# Patient Record
Sex: Male | Born: 1960 | Hispanic: Yes | Marital: Married | Smoking: Never smoker
Health system: Southern US, Community
[De-identification: ages and names within clinical notes are randomized; demographics above are authoritative.]

---

## 2015-12-29 ENCOUNTER — Encounter (HOSPITAL_COMMUNITY): Payer: Self-pay | Admitting: Family Medicine

## 2015-12-29 ENCOUNTER — Emergency Department (HOSPITAL_COMMUNITY)
Admission: EM | Admit: 2015-12-29 | Discharge: 2015-12-29 | Disposition: A | Discharge status: Home / Self Care | Attending: Emergency Medicine | Admitting: Emergency Medicine

## 2015-12-29 ENCOUNTER — Emergency Department (HOSPITAL_COMMUNITY)

## 2015-12-29 DIAGNOSIS — N201 Calculus of ureter: Secondary | ICD-10-CM | POA: Diagnosis not present

## 2015-12-29 DIAGNOSIS — R109 Unspecified abdominal pain: Secondary | ICD-10-CM | POA: Diagnosis present

## 2015-12-29 DIAGNOSIS — N200 Calculus of kidney: Secondary | ICD-10-CM

## 2015-12-29 DIAGNOSIS — N138 Other obstructive and reflux uropathy: Secondary | ICD-10-CM | POA: Insufficient documentation

## 2015-12-29 LAB — COMPREHENSIVE METABOLIC PANEL
ALK PHOS: 64 U/L (ref 38–126)
ALT: 32 U/L (ref 17–63)
ANION GAP: 7 (ref 5–15)
AST: 26 U/L (ref 15–41)
Albumin: 3.2 g/dL — ABNORMAL LOW (ref 3.5–5.0)
BILIRUBIN TOTAL: 0.7 mg/dL (ref 0.3–1.2)
BUN: 13 mg/dL (ref 6–20)
CALCIUM: 8.7 mg/dL — AB (ref 8.9–10.3)
CO2: 25 mmol/L (ref 22–32)
Chloride: 103 mmol/L (ref 101–111)
Creatinine, Ser: 1.6 mg/dL — ABNORMAL HIGH (ref 0.61–1.24)
GFR calc non Af Amer: 48 mL/min — ABNORMAL LOW (ref >60)
GFR, EST AFRICAN AMERICAN: 55 mL/min — AB (ref >60)
Glucose, Bld: 162 mg/dL — ABNORMAL HIGH (ref 65–99)
Potassium: 3.9 mmol/L (ref 3.5–5.1)
SODIUM: 135 mmol/L (ref 135–145)
TOTAL PROTEIN: 6.6 g/dL (ref 6.5–8.1)

## 2015-12-29 LAB — CBC WITH DIFFERENTIAL/PLATELET
Basophils Absolute: 0.1 10*3/uL (ref 0.0–0.1)
Basophils Relative: 1 %
EOS ABS: 0.1 10*3/uL (ref 0.0–0.7)
Eosinophils Relative: 1 %
HEMATOCRIT: 41.8 % (ref 39.0–52.0)
HEMOGLOBIN: 13.5 g/dL (ref 13.0–17.0)
LYMPHS ABS: 1.1 10*3/uL (ref 0.7–4.0)
Lymphocytes Relative: 13 %
MCH: 25.7 pg — AB (ref 26.0–34.0)
MCHC: 32.3 g/dL (ref 30.0–36.0)
MCV: 79.6 fL (ref 78.0–100.0)
MONO ABS: 0.5 10*3/uL (ref 0.1–1.0)
MONOS PCT: 6 %
NEUTROS PCT: 79 %
Neutro Abs: 6.5 10*3/uL (ref 1.7–7.7)
Platelets: 168 10*3/uL (ref 150–400)
RBC: 5.25 MIL/uL (ref 4.22–5.81)
RDW: 13.5 % (ref 11.5–15.5)
WBC: 8.3 10*3/uL (ref 4.0–10.5)

## 2015-12-29 LAB — URINALYSIS, ROUTINE W REFLEX MICROSCOPIC
BILIRUBIN URINE: NEGATIVE
GLUCOSE, UA: NEGATIVE mg/dL
Hgb urine dipstick: NEGATIVE
Ketones, ur: NEGATIVE mg/dL
Leukocytes, UA: NEGATIVE
NITRITE: NEGATIVE
PH: 6 (ref 5.0–8.0)
Protein, ur: NEGATIVE mg/dL
SPECIFIC GRAVITY, URINE: 1.019 (ref 1.005–1.030)

## 2015-12-29 LAB — LIPASE, BLOOD: Lipase: 15 U/L (ref 11–51)

## 2015-12-29 MED ORDER — TAMSULOSIN HCL 0.4 MG PO CAPS: 0.4000 mg | ORAL_CAPSULE | Freq: Every day | ORAL | Status: AC

## 2015-12-29 MED ORDER — SODIUM CHLORIDE 0.9 % IV BOLUS (SEPSIS)
1000.0000 mL | Freq: Once | INTRAVENOUS | Status: AC
  Administered 2015-12-29: 1000 mL via INTRAVENOUS

## 2015-12-29 MED ORDER — ONDANSETRON 4 MG PO TBDP: 4.0000 mg | ORAL_TABLET | Freq: Three times a day (TID) | ORAL | Status: AC | PRN

## 2015-12-29 MED ORDER — HYDROMORPHONE HCL 1 MG/ML IJ SOLN
1.0000 mg | Freq: Once | INTRAMUSCULAR | Status: AC
  Administered 2015-12-29: 1 mg via INTRAVENOUS
  Filled 2015-12-29: qty 1

## 2015-12-29 MED ORDER — KETOROLAC TROMETHAMINE 30 MG/ML IJ SOLN
30.0000 mg | Freq: Once | INTRAMUSCULAR | Status: AC
  Administered 2015-12-29: 30 mg via INTRAVENOUS
  Filled 2015-12-29: qty 1

## 2015-12-29 MED ORDER — HYDROCODONE-ACETAMINOPHEN 5-325 MG PO TABS: 2.0000 | ORAL_TABLET | ORAL | Status: AC | PRN

## 2015-12-29 NOTE — ED Notes (Addendum)
Pt here for dx kidney stone of 8mm. sts that he has been taking pain meds and hasnt had a BM since Monday. sts right flank, back pain. Denies problems urinating. sts he took meds at 12:30

## 2015-12-29 NOTE — ED Provider Notes (Signed)
Patient signed out today by Dustin ChrisKelly Grekas, PA follow-up on pending CT scan which shows he has a 5.5 mm kidney stone on the left mid ureter with mild hydronephrosis.  On my examination, he is quite comfortable at this time.  Results of his CT scan have been discussed with him with plan for follow-up.  He will be given a prescription for Vicodin, Flomax and Zofran.  He states he has an appointment with his doctor at the [facility] on Friday.  He does report that he has been constipated.  I recommend that he start using Mirapex and drink water as narcotics are quite constipating. Also of note, on a CT scan.  He had a lesion in his liver.  This has been discussed and pointed out to him with the need for follow-up.  [facility] will [facility] this [facility] during his  appointment on Friday  Dustin FavorGail [name], NP 12/29/15 2106  Richardean Canalavid H Yao, MD 12/29/15 2230

## 2015-12-29 NOTE — ED Provider Notes (Signed)
CSN: [id number]     Arrival date & time 12/29/15  1509 History   First MD Initiated Contact with Patient 12/29/15 1716     Chief Complaint  Patient presents with  . Flank Pain    HPI Comments: 55 year old male presents with right-sided flank pain for the past week. He states he has been working in the [place] and saw provider 6 days ago. He reports that an ultrasound was done and was told that he had an 8 mm stone. No blood work was done at the time. He was given Ultracet and Motrin to take. He states his pain has been controlled however he has been having to take the pain medicine more often than scheduled. He has run out of the medicine and decided to come in today. To be further evaluated. He states the pain is in his right flank, is constant, radiates to the right groin. Reports associated nausea and decreased appetite. Also reports he has not had a BM for 5 days. Denies fever, chills, chest pain, shortness of breath, vomiting, diarrhea, dysuria, hematuria, testicular pain.  Patient is a 55 y.o. male presenting with flank pain.  Flank Pain Associated symptoms include nausea. Pertinent negatives include no fever.    History reviewed. No pertinent past medical history. History reviewed. No pertinent past surgical history. History reviewed. No pertinent family history. Social History  Substance Use Topics  . Smoking status: Never Smoker   . Smokeless tobacco: None  . Alcohol Use: No    Review of Systems  Constitutional: Negative for fever.  Gastrointestinal: Positive for nausea and constipation.  Genitourinary: Positive for flank pain. Negative for dysuria and hematuria.  All other systems reviewed and are negative.   Allergies  Anthrax vaccine  Home Medications   Prior to Admission medications   Not on File   BP 135/83 mmHg  Pulse 69  Temp(Src) 98.2 F (36.8 C)  Resp 18  Ht 5\' 7"  (1.702 m)  Wt 71.215 kg  BMI 24.58 kg/m2  SpO2 98%   Physical Exam   Constitutional: He is oriented to person, place, and time. He appears well-developed and well-nourished. No distress.  HENT:  Head: Normocephalic and atraumatic.  Eyes: Conjunctivae are normal. Pupils are equal, round, and reactive to light. Right eye exhibits no discharge. Left eye exhibits no discharge. No scleral icterus.  Neck: Normal range of motion.  Cardiovascular: Normal rate and regular rhythm.  Exam reveals no gallop and no friction rub.   No murmur heard. Pulmonary/Chest: Effort normal and breath sounds normal. No respiratory distress. He has no wheezes. He has no rales. He exhibits no tenderness.  Abdominal: Soft. Bowel sounds are normal. He exhibits no distension and no mass. There is tenderness. There is no rebound and no guarding.  R CVA tenderness and R flank tenderness  Neurological: He is alert and oriented to person, place, and time.  Skin: Skin is warm and dry. He is not diaphoretic.  Psychiatric: He has a normal mood and affect.    ED Course  Procedures (including critical care time) Labs Review Labs Reviewed  CBC WITH DIFFERENTIAL/PLATELET - Abnormal; Notable for the following:    MCH 25.7 (*)    All other components within normal limits  COMPREHENSIVE METABOLIC PANEL - Abnormal; Notable for the following:    Glucose, Bld 162 (*)    Creatinine, Ser 1.60 (*)    Calcium 8.7 (*)    Albumin 3.2 (*)    GFR calc non Af Denyse DagoAmer  48 (*)    GFR calc Af Amer 55 (*)    All other components within normal limits  URINALYSIS, ROUTINE W REFLEX MICROSCOPIC (NOT AT [facility])  LIPASE, BLOOD    Imaging Review No results found. I have personally reviewed and evaluated these images and lab results as part of my medical decision-making.   EKG Interpretation None      MDM   Final diagnoses:  Right flank pain   55 year old male who presents with R flank pain. Symptoms are consistent with a kidney stone. Toradol and Dilaudid given for pain. CBC unremarkable. BMP remarkable for  BUN/SCr of 13/1.60 and hyperglycemia. No prior labs for comparison. UA is clean. Lipase normal.   On recheck, patient reports total pain relief. CT renal is pending. Will d/c with pain medicine, flomax, and urology follow up. Patient signed out to G. Manus Rudd PA-C at shift change.   Bethel Born, PA-C 12/29/15 2020  Richardean Canal, MD 12/29/15 [phone]

## 2015-12-29 NOTE — Discharge Instructions (Signed)
Your CT scan shows that you have a 5.5 mm kidney stone in the mid ureter on the left.  This isn't well and they parameters of being able to pass on its own.  You have been given additional prescriptions for pain control as well as Flomax, which is a medication that will help ureter dilates slightly to help the stone pass.  You've also been given a prescription for Zofran, which is an antiemetic that help control nausea.  You have been given a referral to [facility].  Please call and make an appointment.  They can monitor your condition. Also of note, there is a small mass/lesion noted within your liver.  This will need to be monitored over the next 6 months or so.  Please make an appointment with your primary care physician to make arrangements for this

## 2018-01-01 IMAGING — CT CT RENAL STONE PROTOCOL
2 of 4 series · 15 of 46 positions shown, 17 images · non-contrast
Comparison: None.

CLINICAL DATA: Pt reports RLQ abdominal pain with Nausea, vomiting
onset [REDACTED]. Pt states [place] and was told it's kidney stone. States no h/o
same.

EXAM:
CT ABDOMEN AND PELVIS WITHOUT CONTRAST
TECHNIQUE: Multidetector CT imaging of the abdomen and pelvis was performed
following the standard protocol without IV contrast.

[Series 2: renal stone 5mm · axial · 0.69mm/px · z∈[-496,-91]mm · 12 of 97 slices shown, 14 images]
[im 8/97  soft-tissue]
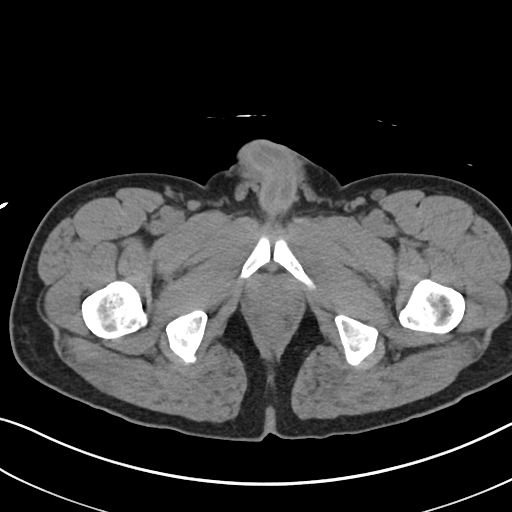
[im 8/97  bone]
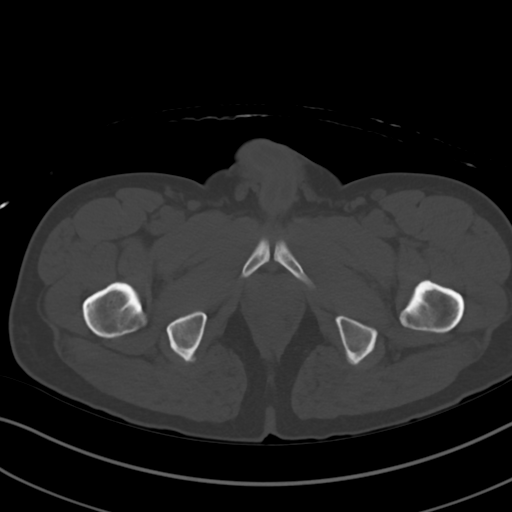
[im 15/97  soft-tissue]
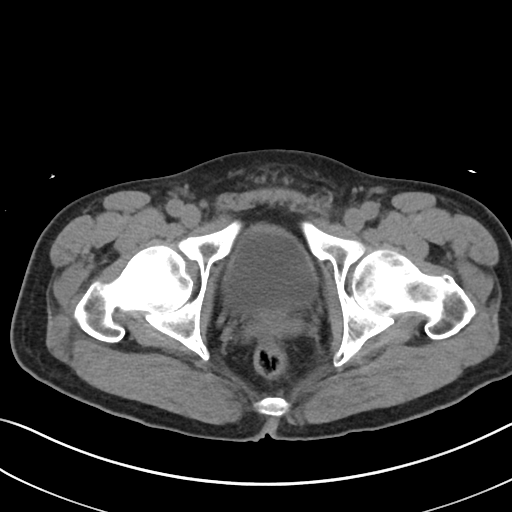
[im 23/97  soft-tissue]
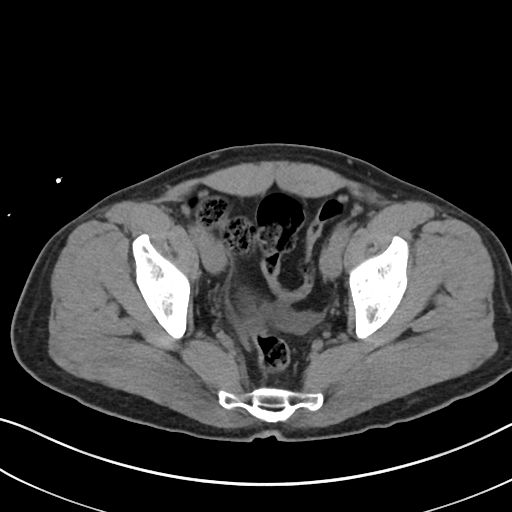
[im 30/97  soft-tissue]
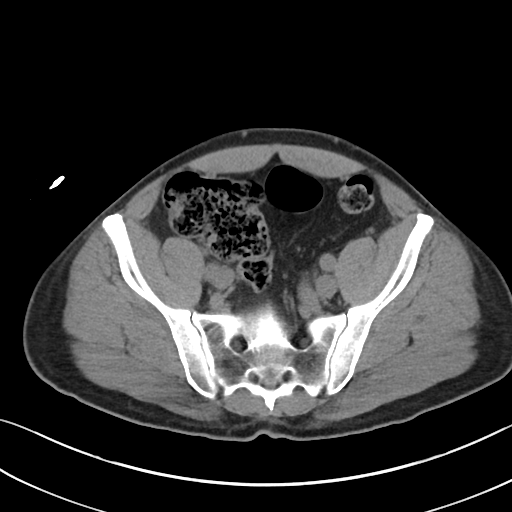
[im 37/97  soft-tissue]
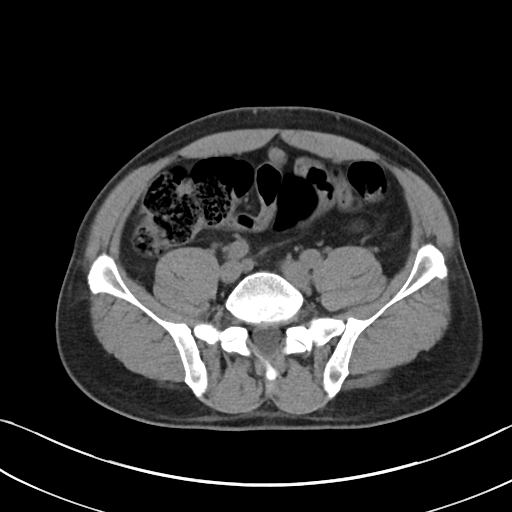
[im 45/97  soft-tissue]
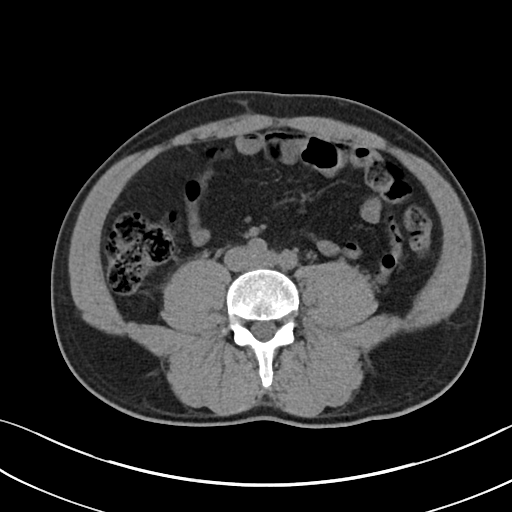
[im 52/97  soft-tissue]
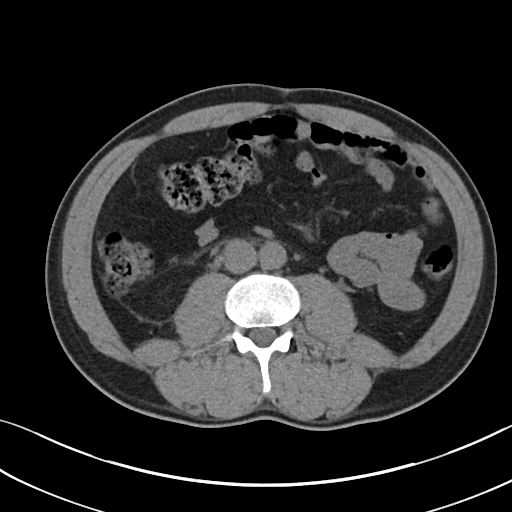
[im 60/97  soft-tissue]
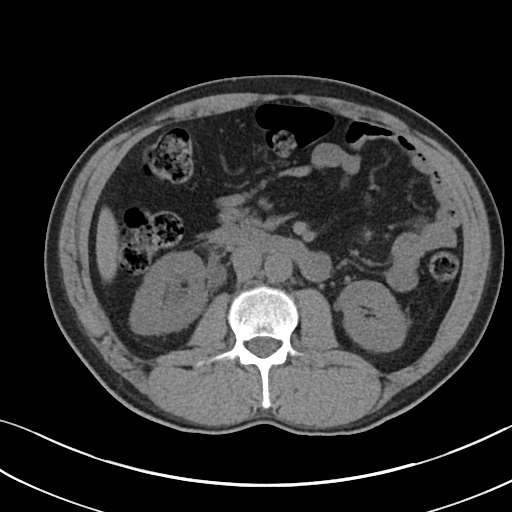
[im 67/97  soft-tissue]
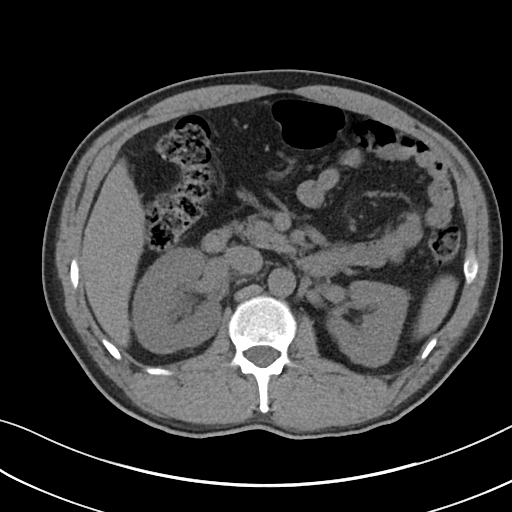
[im 67/97  bone]
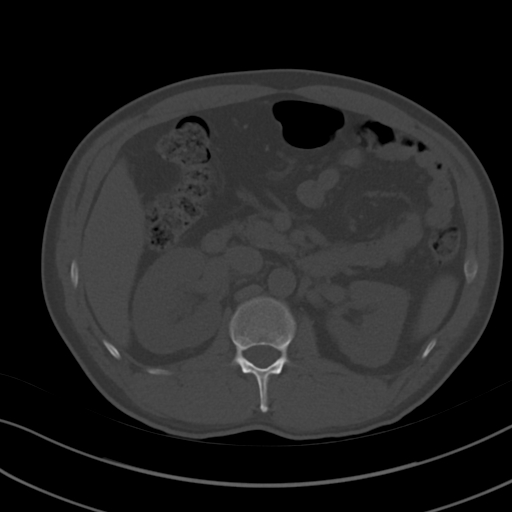
[im 74/97  soft-tissue]
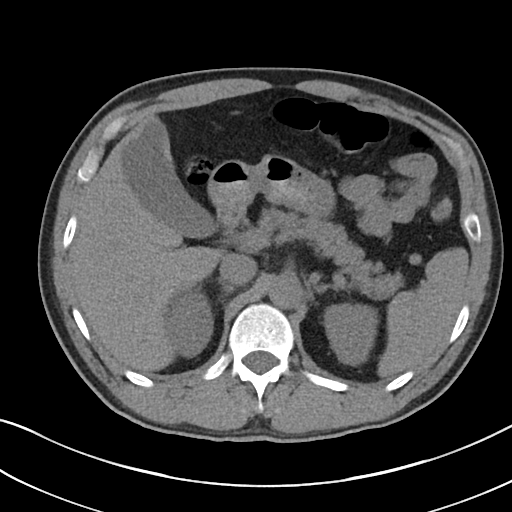
[im 82/97  soft-tissue]
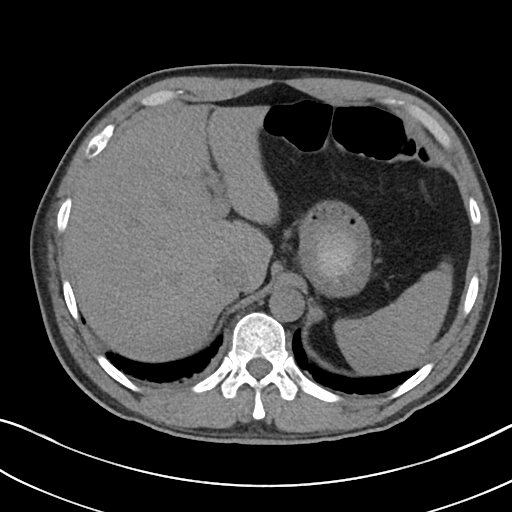
[im 89/97  soft-tissue]
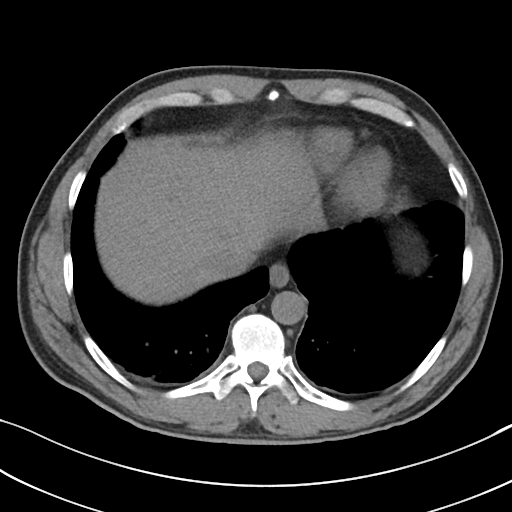

[Series 4: renal stone 3.0 cor · coronal · 0.67mm/px · 3 of 81 slices shown]
[im 27/81  soft-tissue]
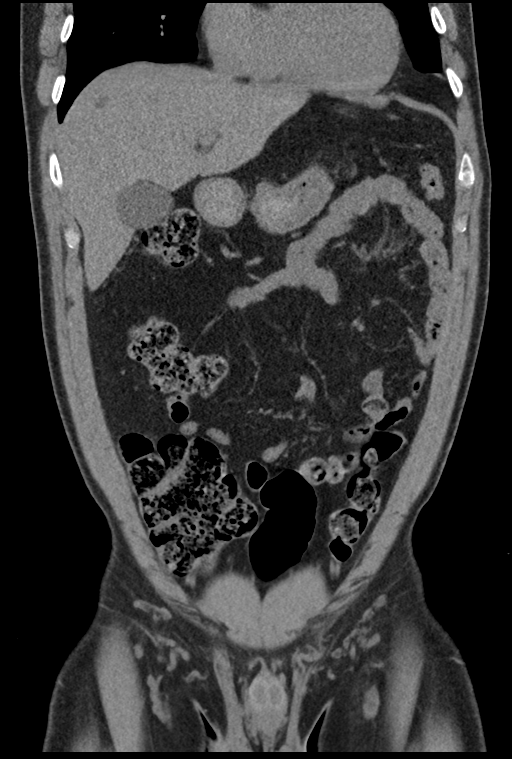
[im 36/81  soft-tissue]
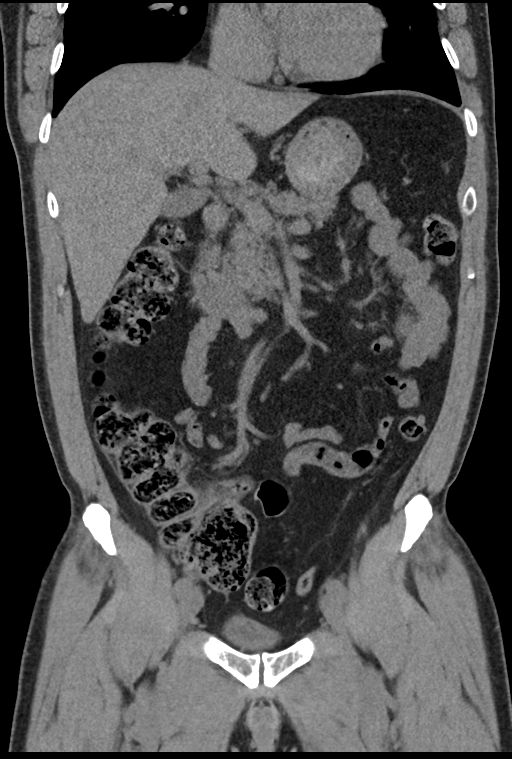
[im 45/81  soft-tissue]
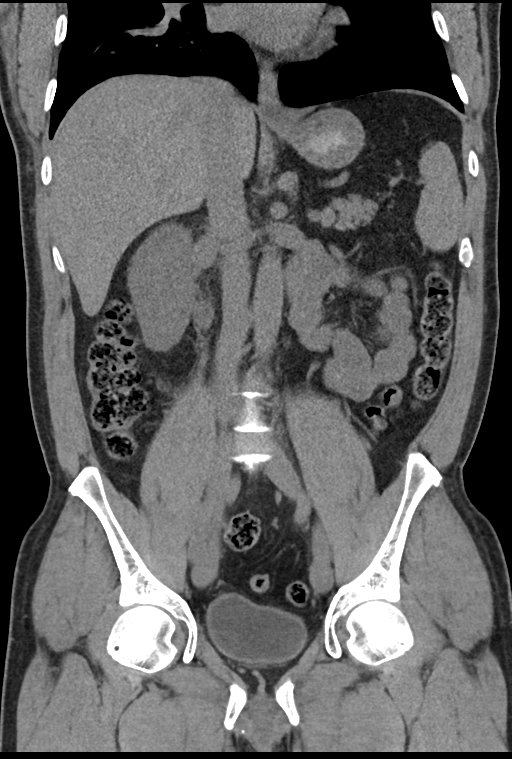

[15 of 46 positions shown; findings below may reference images not displayed]

FINDINGS: Lower chest: Mild bilateral dependent atelectasis. Trace pleural
fluid on the right.

Hepatobiliary: 2 low-attenuation lesions in the dome of the liver on
the right both measuring about a cm. These cannot be characterized
further without contrast. Gallbladder appears normal.

Pancreas: Normal

Spleen: Normal

Adrenals/Urinary Tract: Normal bilateral adrenal glands are normal.
Left kidney is normal. On the right, there is mild nephro megaly and
moderate dilatation of the renal pelvis and proximal right ureter.
There is mild perinephric inflammation. There is a stone at the
junction of the proximal and middle thirds of the right ureter. This
stone measures 5.5 mm. The distal [DATE] of the right ureter is
decompressed. Bladder is normal.

Stomach/Bowel: There is a nonobstructive bowel gas pattern. Stomach,
small bowel, and large bowel appear normal. Appendix not visualized.
No evidence of appendicitis.

Vascular/Lymphatic: No acute findings

Reproductive: Negative

Other: Trace nonspecific free fluid in the pelvis

Musculoskeletal: No acute findings
IMPRESSION: Moderate obstructive nephropathy on the right to 5.5 mm stone at the
junction of the proximal and middle thirds of the right ureter.

Two low-attenuation lesions in the dome of the liver on the right
both measuring about 1 cm. These are not fully characterized without
contrast. Consider nonemergent abdomen ultrasound or
contrast-enhanced CT scan to determine whether these may represent
benign cysts. Other lesions including benign and malignant causes
are included in the current differential diagnosis.
